# Patient Record
Sex: Male | Born: 2000
Health system: Southern US, Community
[De-identification: ages and names within clinical notes are randomized; demographics above are authoritative.]

## PROBLEM LIST (undated history)

## (undated) DIAGNOSIS — F32A Depression, unspecified: Secondary | ICD-10-CM

## (undated) DIAGNOSIS — A5600 Chlamydial infection of lower genitourinary tract, unspecified: Secondary | ICD-10-CM

## (undated) DIAGNOSIS — R1013 Epigastric pain: Secondary | ICD-10-CM

## (undated) DIAGNOSIS — F329 Major depressive disorder, single episode, unspecified: Secondary | ICD-10-CM

## (undated) HISTORY — DX: Chlamydial infection of lower genitourinary tract, unspecified: A56.00

## (undated) HISTORY — DX: Epigastric pain: R10.13

## (undated) HISTORY — DX: Depression, unspecified: F32.A

---
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
## 2022-02-14 ENCOUNTER — Ambulatory Visit (INDEPENDENT_AMBULATORY_CARE_PROVIDER_SITE_OTHER): Payer: No Typology Code available for payment source | Admitting: Family Medicine

## 2022-02-14 ENCOUNTER — Ambulatory Visit
Admission: RE | Admit: 2022-02-14 | Discharge: 2022-02-14 | Disposition: A | Discharge status: Home / Self Care | Payer: No Typology Code available for payment source | Source: Ambulatory Visit | Attending: Family Medicine | Admitting: Family Medicine

## 2022-02-14 ENCOUNTER — Encounter: Payer: Self-pay | Admitting: Family Medicine

## 2022-02-14 VITALS — BP 104/70 | HR 70 | Temp 97.0°F | Wt 159.6 lb

## 2022-02-14 DIAGNOSIS — M25532 Pain in left wrist: Secondary | ICD-10-CM

## 2022-02-14 DIAGNOSIS — M25531 Pain in right wrist: Secondary | ICD-10-CM

## 2022-02-14 NOTE — Progress Notes (Signed)
? ?  Subjective:  ? ? Patient ID: James Booker, male    DOB: Aug 09, 2001, 21 y.o.   MRN: [id number] ? ?HPI ?He is here for evaluation of 2 issues.  He states that about 1 week ago he had a weight fall and land on his right wrist at the base of the fifth metacarpal.  He still having some slight discomfort with that. ?He also states that approximately 2 months ago while wrestling he gripped somebody very strenuously with his left hand and then felt some wrist pain after that.  The pain is still present now and he notes that it bothers him especially when he lifts heavy objects. ? ? ?Review of Systems ? ?   ?Objective:  ? Physical Exam ?Exam of the right hand does show some minimal tenderness palpation at the base of the fifth metacarpal.  Full motion of the fifth finger with no wrist tenderness or swelling noted. ?Exam of the left full motion and strength no palpable tenderness over the carpals or metacarpals.  Normal motion of the fingers and wrist. ? ? ? ?   ?Assessment & Plan:  ?Pain in both wrists - Plan: DG Hand Complete Right, DG Wrist Complete Left, Ambulatory referral to Orthopedic Surgery ?I explained that I thought the right wrist is probably a contusion but will get an x-ray did be safe. ?I explained that since he continues have difficulty with the left wrist we will get an x-ray and refer to orthopedics as I think this is probably a soft tissue injury especially with the fact that this lasted for several months. ? ?

## 2022-02-27 ENCOUNTER — Encounter: Payer: Self-pay | Admitting: Orthopedic Surgery

## 2022-02-27 ENCOUNTER — Ambulatory Visit (INDEPENDENT_AMBULATORY_CARE_PROVIDER_SITE_OTHER): Payer: No Typology Code available for payment source | Admitting: Orthopedic Surgery

## 2022-02-27 DIAGNOSIS — M25532 Pain in left wrist: Secondary | ICD-10-CM

## 2022-02-27 MED ORDER — MELOXICAM 7.5 MG PO TABS
7.5000 mg | ORAL_TABLET | Freq: Every day | ORAL | 0 refills | Status: AC
Start: 2022-02-27 — End: 2022-03-29

## 2022-02-27 NOTE — Progress Notes (Signed)
? ?Office Visit Note ?  ?Patient: James Booker           ?Date of Birth: 10-Jul-2001           ?MRN: [id number] ?Visit Date: 02/27/2022 ?             ?Requested by: Denita Lung, MD ?[address] ?[place],  [place] [address] ?PCP: Denita Lung, MD ? ? ?Assessment & Plan: ?Visit Diagnoses:  ?1. Pain in left wrist   ? ? ?Plan: At this point, patient's wrist pain seems most consistent with possible FCR tendinitis.  All of his pain is at the volar aspect of the wrist at the distal aspect of the FCR tendon around the wrist flexion crease.  He has some mild discomfort with resisted wrist flexion.  The remainder of exam is unremarkable.  We will try a stronger oral anti-inflammatory and I will refer him to hand therapy.  I can see him back in 4 to 6 weeks to see how he is progressing. ? ?Follow-Up Instructions: No follow-ups on file.  ? ?Orders:  ?No orders of the defined types were placed in this encounter. ? ?No orders of the defined types were placed in this encounter. ? ? ? ? Procedures: ?No procedures performed ? ? ?Clinical Data: ?No additional findings. ? ? ?Subjective: ?Chief Complaint  ?Patient presents with  ? Left Wrist - Pain  ? ? ?This is an 21 year old right-hand-dominant male presents with left wrist pain.  This started approximately 5 months ago.  He is on the wrestling team and during a match was holding up on his arm tightly with his left hand.  After that he has pain in the volar aspect of the wrist.  He does not think there is any kind twisting movement or rotational injury.  Since then his pain has continued over the last 5 months but it has improved.  Previously had pain with any range of motion of the wrist.  Now he only has pain with lifting heavy weights.  His pain is 3/10 when lifting a heavy object.  He denies pain at the volar, radial, or ulnar aspects of the wrist.  The pain is localized to the volar radial wrist along the distal aspect of the FCR tendon.  He has taken ibuprofen  occasionally but otherwise has had no treatment for this. ? ? ?Review of Systems ? ? ?Objective: ?Vital Signs: BP (!) 118/50 (BP Location: Left Arm, Patient Position: Sitting)   Pulse 61   Ht 5' 8.5" (1.74 m)   Wt 159 lb 11.2 oz (72.4 kg)   BMI 23.93 kg/m?  ? ?Physical Exam ?Constitutional:   ?   Appearance: Normal appearance.  ?Cardiovascular:  ?   Rate and Rhythm: Normal rate.  ?   Pulses: Normal pulses.  ?Pulmonary:  ?   Effort: Pulmonary effort is normal.  ?Skin: ?   General: Skin is warm and dry.  ?   Capillary Refill: Capillary refill takes less than 2 seconds.  ?Neurological:  ?   Mental Status: He is alert.  ? ? ?Left Hand Exam  ? ?Tenderness  ?Left hand tenderness location: TTP along FCR tendon at level of wrist flexion crease.  No swelling.  ? ?Range of Motion  ?Wrist  ?Left wrist extension: 60.  ?Left wrist flexion: 55.  ?Pronation:  normal  ?Supination:  normal  ? ?Other  ?Erythema: absent ?Sensation: normal ?Pulse: present ? ?Comments:  No TTP at ulnar, dorsal, or radial aspects  of wrist.  Mild pain w/ resisted wrist flexion.  No other positive provocative signs.  No swelling.  ? ? ? ? ?Specialty Comments:  ?No specialty comments available. ? ?Imaging: ?No results found. ? ? ?PMFS History: ?Patient Active Problem List  ? Diagnosis Date Noted  ? Pain in left wrist 02/27/2022  ? Cannabis use disorder, moderate, in early remission ([place]) 09/07/2019  ? Disruptive mood dysregulation disorder ([place]) 09/07/2019  ? Penile discharge 06/15/2019  ? Venereal disease 06/15/2019  ? Dysuria 06/15/2019  ? ?Past Medical History:  ?Diagnosis Date  ? Cannabis use disorder, moderate, in early remission ([place]) 09/07/2019  ? Depression   ? Dyspepsia   ? Infection of lower genitourinary tract due to Chlamydia   ?  ?Family History  ?Problem Relation Age of Onset  ? Cancer Other   ? Healthy Mother   ? Healthy Father   ?  ?No past surgical history on file. ?Social History  ? ?Occupational History  ? Occupation: Ship broker, Science writer  ?Tobacco Use  ? Smoking status: Never  ? Smokeless tobacco: Never  ?Vaping Use  ? Vaping Use: Never used  ?Substance and Sexual Activity  ? Alcohol use: Yes  ? Drug use: Not Currently  ?  Types: Marijuana  ? Sexual activity: Yes  ?  Birth control/protection: None, Condom  ? ? ? ? ? ? ?

## 2022-03-06 ENCOUNTER — Encounter: Payer: No Typology Code available for payment source | Admitting: Rehabilitative and Restorative Service Providers"

## 2022-03-06 NOTE — Therapy (Incomplete)
?OUTPATIENT OCCUPATIONAL THERAPY ORTHO EVALUATION ? ?Patient Name: James Booker ?MRN: [id number] ?DOB:06/01/2001, 21 y.o., male ?Today's Date: 03/06/2022 ? ?PCP: Denita Lung, MD ?REFERRING PROVIDER: Sherilyn Cooter, MD ? ? ? ?Past Medical History:  ?Diagnosis Date  ? Cannabis use disorder, moderate, in early remission ([place]) 09/07/2019  ? Depression   ? Dyspepsia   ? Infection of lower genitourinary tract due to Chlamydia   ? ?No past surgical history on file. ?Patient Active Problem List  ? Diagnosis Date Noted  ? Pain in left wrist 02/27/2022  ? Cannabis use disorder, moderate, in early remission ([place]) 09/07/2019  ? Disruptive mood dysregulation disorder ([place]) 09/07/2019  ? Penile discharge 06/15/2019  ? Venereal disease 06/15/2019  ? Dysuria 06/15/2019  ? ? ?ONSET DATE: ~5 months  ? ?REFERRING DIAG:  ?  ?M25.532 (ICD-10-CM) - Pain in left wrist  ? ? ?THERAPY DIAG:  ?No diagnosis found. ? ?SUBJECTIVE:  ? ?SUBJECTIVE STATEMENT: ?*** ?Pt accompanied by: {accompnied:27141} ? ?PERTINENT HISTORY: Per MD: possible FCR tendonitis."This is an 21 year old right-hand-dominant male presents with left wrist pain.  This started approximately 5 months ago.  He is on the wrestling team and during a match was holding up on his arm tightly with his left hand.  After that he has pain in the volar aspect of the wrist.  He does not think there is any kind twisting movement or rotational injury.  Since then his pain has continued over the last 5 months but it has improved.  Previously had pain with any range of motion of the wrist.  Now he only has pain with lifting heavy weights.  His pain is 3/10 when lifting a heavy object." ? ?PRECAUTIONS: None ? ?WEIGHT BEARING RESTRICTIONS No ? ?PAIN:  ?Are you having pain? *** ?Rating: ***/10 ? ?FALLS: Has patient fallen in last 6 months? {fallsyesno:27318} ? ?LIVING ENVIRONMENT: ?Lives with: {[facility] lives with:25569::"lives with their family"} ?Lives in: {Lives in:25570} ?Stairs:  {opstairs:27293} ?Has following equipment at home: {Assistive devices:23999} ? ?PLOF: {PLOF:24004} ? ?PATIENT GOALS *** ? ?OBJECTIVE:  ? ?HAND DOMINANCE: {MISC; OT HAND DOMINANCE:[phone]} ? ?ADLs: ?Overall ADLs: *** ? ? ?FUNCTIONAL OUTCOME MEASURES: ?{OTFUNCTIONALMEASURES:27238} ? ?UE ROM    ? ?{AROM/PROM:27142} ROM Right ?03/06/2022 Left ?03/06/2022  ?Shoulder flexion    ?Shoulder abduction    ?Shoulder adduction    ?Shoulder extension    ?Shoulder internal rotation    ?Shoulder external rotation    ?Elbow flexion    ?Elbow extension    ?Wrist flexion    ?Wrist extension    ?Wrist ulnar deviation    ?Wrist radial deviation    ?Wrist pronation    ?Wrist supination    ?(Blank rows = not tested) ? ?{AROM/PROM:27142} ROM Right ?03/06/2022 Left ?03/06/2022  ?Thumb MCP (0-60)    ?Thumb IP (0-80)    ?Thumb Radial abd/add (0-55)     ?Thumb Palmar abd/add (0-45)     ?Thumb Opposition to Small Finger     ?Index MCP (0-90)     ?Index PIP (0-100)     ?Index DIP (0-70)      ?Long MCP (0-90)      ?Long PIP (0-100)      ?Long DIP (0-70)      ?Ring MCP (0-90)      ?Ring PIP (0-100)      ?Ring DIP (0-70)      ?Little MCP (0-90)      ?Little PIP (0-100)      ?Little DIP (0-70)      ?(  Blank rows = not tested) ? ? ?UE MMT:    ? ?MMT Right ?03/06/2022 Left ?03/06/2022  ?Shoulder flexion    ?Shoulder abduction    ?Shoulder adduction    ?Shoulder extension    ?Shoulder internal rotation    ?Shoulder external rotation    ?Middle trapezius    ?Lower trapezius    ?Elbow flexion    ?Elbow extension    ?Wrist flexion    ?Wrist extension    ?Wrist ulnar deviation    ?Wrist radial deviation    ?Wrist pronation    ?Wrist supination    ?(Blank rows = not tested) ? ?HAND FUNCTION: ?{handfunction:27230} ? ?COORDINATION: ?{otcoordination:27237} ? ?SENSATION: ?{sensation:27233} ? ?EDEMA: *** ? ?COGNITION: ?Overall cognitive status: {cognition:24006} ?Areas of impairment: {impairedcognition:27234} ? ?OBSERVATIONS: *** ? ? ?TODAY'S TREATMENT:  ?03/06/22:  Eval: *** ? ? ? ?PATIENT EDUCATION: ?Education details: see tx section above for details  ?Person educated: Patient ?Education method: Explanation, Demonstration, Verbal cues, and Handouts ?Education comprehension: verbalized understanding, returned demonstration, verbal cues required, and needs further education ? ? ?HOME EXERCISE PROGRAM: ?see tx section above for details  ? ?GOALS: ?Goals reviewed with patient? {yes/no:20286} ? ? ?SHORT TERM GOALS: (STG required if POC>30 days) ? ?Pt will obtain protective, custom orthotic. ?Target date: TBD as needed ?Goal status: Initial ? ?2.  Pt will demo/state understanding of initial HEP to improve pain levels and prerequisite motion. ?Target date: *** ?Goal status: INITIAL ? ? ?LONG TERM GOALS: ? ?Pt will improve functional ability by decreased impairment per Quick DASH assessment from ***% to ***% or better, for better quality of life. ?Target date: *** ?Goal status: INITIAL ? ?2.  Pt will improve grip strength in *** hand from ***lbs to at least ***lbs for functional use at home and in IADLs. ?Target date: *** ?Goal status: INITIAL ? ?3.  Pt will improve A/ROM in *** from *** to at least ***, to have functional motion for tasks like reach and grasp.  ? ?Target date: *** ?Goal status: INITIAL ? ?4.  Pt will improve strength in *** from *** MMT to at least *** MMT to have increased functional ability to carry out selfcare and higher-level homecare tasks with no difficulty. ?Target date: *** ?Goal status: INITIAL ? ?5.  Pt will decrease pain at worst from ***/10 to ***/10 or better to have better sleep and occupational participation in daily roles. ?Target date: *** ?Goal status: INITIAL ? ? ?ASSESSMENT: ? ?CLINICAL IMPRESSION: ?Patient is a 21 y.o. male who was seen today for occupational therapy evaluation for left wrist pain and decreased occupational performance.  ? ?PERFORMANCE DEFICITS in functional skills including {OT physical skills:25468}, cognitive skills  including {OT cognitive skills:25469}, and psychosocial skills including {OT psychosocial skills:25470}.  ? ?IMPAIRMENTS are limiting patient from {OT performance deficits:25471}.  ? ?COMORBIDITIES has co-morbidities such as mood disorder  that affects occupational performance. Patient will benefit from skilled OT to address above impairments and improve overall function. ? ?MODIFICATION OR ASSISTANCE TO COMPLETE EVALUATION: {OT modification:25474} ? ?OT OCCUPATIONAL PROFILE AND HISTORY: {OT PROFILE AND HISTORY:25484} ? ?CLINICAL DECISION MAKING: {OT CDM:25475} ? ?REHAB POTENTIAL: {rehabpotential:25112} ? ?EVALUATION COMPLEXITY: {Evaluation complexity:25115} ? ? ? ? ? ?PLAN: ?OT FREQUENCY: {rehab frequency:25116} ? ?OT DURATION: {rehab duration:25117} ? ?PLANNED INTERVENTIONS: {OT Interventions:25467} ? ?RECOMMENDED OTHER SERVICES: *** ? ?CONSULTED AND AGREED WITH PLAN OF CARE: [id number] ? ?PLAN FOR NEXT SESSION: *** ? ? ?Benito Mccreedy, OTR/L, CHT ?03/06/2022, 7:51 AM ?  ?

## 2022-05-21 ENCOUNTER — Ambulatory Visit (INDEPENDENT_AMBULATORY_CARE_PROVIDER_SITE_OTHER): Payer: No Typology Code available for payment source | Admitting: Surgery

## 2022-05-21 ENCOUNTER — Other Ambulatory Visit (HOSPITAL_COMMUNITY): Payer: Self-pay

## 2022-05-21 ENCOUNTER — Ambulatory Visit: Payer: Self-pay

## 2022-05-21 DIAGNOSIS — S82892A Other fracture of left lower leg, initial encounter for closed fracture: Secondary | ICD-10-CM | POA: Diagnosis not present

## 2022-05-21 DIAGNOSIS — S93401A Sprain of unspecified ligament of right ankle, initial encounter: Secondary | ICD-10-CM

## 2022-05-21 MED ORDER — TRAMADOL HCL 50 MG PO TABS
50.0000 mg | ORAL_TABLET | Freq: Four times a day (QID) | ORAL | 0 refills | Status: DC | PRN
  Filled 2022-05-21: qty 40, 10d supply, fill #0
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
## 2022-05-28 ENCOUNTER — Ambulatory Visit (INDEPENDENT_AMBULATORY_CARE_PROVIDER_SITE_OTHER): Payer: No Typology Code available for payment source | Admitting: Orthopedic Surgery

## 2022-05-28 ENCOUNTER — Encounter: Payer: Self-pay | Admitting: Orthopedic Surgery

## 2022-05-28 DIAGNOSIS — S93402A Sprain of unspecified ligament of left ankle, initial encounter: Secondary | ICD-10-CM

## 2022-05-28 DIAGNOSIS — S82892A Other fracture of left lower leg, initial encounter for closed fracture: Secondary | ICD-10-CM

## 2022-05-28 DIAGNOSIS — S93401A Sprain of unspecified ligament of right ankle, initial encounter: Secondary | ICD-10-CM
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
## 2022-05-30 ENCOUNTER — Other Ambulatory Visit (HOSPITAL_COMMUNITY): Payer: Self-pay

## 2022-06-04 ENCOUNTER — Ambulatory Visit (INDEPENDENT_AMBULATORY_CARE_PROVIDER_SITE_OTHER): Payer: No Typology Code available for payment source | Admitting: Family Medicine

## 2022-06-04 VITALS — BP 110/70 | HR 85 | Temp 97.5°F | Ht 69.0 in | Wt 155.0 lb

## 2022-06-04 DIAGNOSIS — Z0000 Encounter for general adult medical examination without abnormal findings: Secondary | ICD-10-CM | POA: Diagnosis not present

## 2022-06-04 DIAGNOSIS — Z1159 Encounter for screening for other viral diseases: Secondary | ICD-10-CM

## 2022-06-04 NOTE — Patient Instructions (Signed)
Health Maintenance, Male Adopting a healthy lifestyle and getting preventive care are important in promoting health and wellness. Ask your health care provider about: The right schedule for you to have regular tests and exams. Things you can do on your own to prevent diseases and keep yourself healthy. What should I know about diet, weight, and exercise? Eat a healthy diet  Eat a diet that includes plenty of vegetables, fruits, low-fat dairy products, and lean protein. Do not eat a lot of foods that are high in solid fats, added sugars, or sodium. Maintain a healthy weight Body mass index (BMI) is a measurement that can be used to identify possible weight problems. It estimates body fat based on height and weight. Your health care provider can help determine your BMI and help you achieve or maintain a healthy weight. Get regular exercise Get regular exercise. This is one of the most important things you can do for your health. Most adults should: Exercise for at least 150 minutes each week. The exercise should increase your heart rate and make you sweat (moderate-intensity exercise). Do strengthening exercises at least twice a week. This is in addition to the moderate-intensity exercise. Spend less time sitting. Even light physical activity can be beneficial. Watch cholesterol and blood lipids Have your blood tested for lipids and cholesterol at 20 years of age, then have this test every 5 years. You may need to have your cholesterol levels checked more often if: Your lipid or cholesterol levels are high. You are older than 19 years of age. You are at high risk for heart disease. What should I know about cancer screening? Many types of cancers can be detected early and may often be prevented. Depending on your health history and family history, you may need to have cancer screening at various ages. This may include screening for: Colorectal cancer. Prostate cancer. Skin cancer. Lung  cancer. What should I know about heart disease, diabetes, and high blood pressure? Blood pressure and heart disease High blood pressure causes heart disease and increases the risk of stroke. This is more likely to develop in people who have high blood pressure readings or are overweight. Talk with your health care provider about your target blood pressure readings. Have your blood pressure checked: Every 3-5 years if you are 18-39 years of age. Every year if you are 40 years old or older. If you are between the ages of 65 and 75 and are a current or former smoker, ask your health care provider if you should have a one-time screening for abdominal aortic aneurysm (AAA). Diabetes Have regular diabetes screenings. This checks your fasting blood sugar level. Have the screening done: Once every three years after age 45 if you are at a normal weight and have a low risk for diabetes. More often and at a younger age if you are overweight or have a high risk for diabetes. What should I know about preventing infection? Hepatitis B If you have a higher risk for hepatitis B, you should be screened for this virus. Talk with your health care provider to find out if you are at risk for hepatitis B infection. Hepatitis C Blood testing is recommended for: Everyone born from 1945 through 1965. Anyone with known risk factors for hepatitis C. Sexually transmitted infections (STIs) You should be screened each year for STIs, including gonorrhea and chlamydia, if: You are sexually active and are younger than 19 years of age. You are older than 19 years of age and your   health care provider tells you that you are at risk for this type of infection. Your sexual activity has changed since you were last screened, and you are at increased risk for chlamydia or gonorrhea. Ask your health care provider if you are at risk. Ask your health care provider about whether you are at high risk for HIV. Your health care provider  may recommend a prescription medicine to help prevent HIV infection. If you choose to take medicine to prevent HIV, you should first get tested for HIV. You should then be tested every 3 months for as long as you are taking the medicine. Follow these instructions at home: Alcohol use Do not drink alcohol if your health care provider tells you not to drink. If you drink alcohol: Limit how much you have to 0-2 drinks a day. Know how much alcohol is in your drink. In the [place] one drink equals one 12 oz bottle of beer (355 mL), one 5 oz glass of wine (148 mL), or one 1 oz glass of hard liquor (44 mL). Lifestyle Do not use any products that contain nicotine or tobacco. These products include cigarettes, chewing tobacco, and vaping devices, such as e-cigarettes. If you need help quitting, ask your health care provider. Do not use street drugs. Do not share needles. Ask your health care provider for help if you need support or information about quitting drugs. General instructions Schedule regular health, dental, and eye exams. Stay current with your vaccines. Tell your health care provider if: You often feel depressed. You have ever been abused or do not feel safe at home. Summary Adopting a healthy lifestyle and getting preventive care are important in promoting health and wellness. Follow your health care provider's instructions about healthy diet, exercising, and getting tested or screened for diseases. Follow your health care provider's instructions on monitoring your cholesterol and blood pressure. This information is not intended to replace advice given to you by your health care provider. Make sure you discuss any questions you have with your health care provider. Document Revised: 03/20/2021 Document Reviewed: 03/20/2021 Elsevier Patient Education  2023 Elsevier Inc.  

## 2022-06-04 NOTE — Progress Notes (Signed)
Complete physical exam  Patient: James Booker   DOB: [birth year]   21 y.o. Male  MRN: [id number]  Subjective:    Chief Complaint  Patient presents with   Annual Exam    James Booker is a 21 y.o. male who presents today for a complete physical exam. He reports consuming a general diet.  Staying active at least 20 min a day  He generally feels well. He reports sleeping well. He does not have additional problems to discuss today.  Smoking and drinking was discussed with him.  He is not presently sexually active.  He has a brother that has type 1 diabetes.  He is getting ready to go away to college.  He is looking forward to this.   Most recent fall risk assessment:    04/15/2018    2:03 PM  [place] in the past year? No     Most recent depression screenings:    12/08/2021   10:02 AM 05/12/2020    2:22 PM  PHQ 2/9 Scores  PHQ - 2 Score 0 0  PHQ- 9 Score 1       Patient Active Problem List   Diagnosis Date Noted   Pain in left wrist 02/27/2022   Cannabis use disorder, moderate, in early remission ([place]) 09/07/2019   Disruptive mood dysregulation disorder ([place]) 09/07/2019   Penile discharge 06/15/2019   Venereal disease 06/15/2019   Dysuria 06/15/2019   Past Medical History:  Diagnosis Date   Cannabis use disorder, moderate, in early remission ([place]) 09/07/2019   Depression    Dyspepsia    Infection of lower genitourinary tract due to Chlamydia    No past surgical history on file. Social History   Tobacco Use   Smoking status: Never   Smokeless tobacco: Never  Vaping Use   Vaping Use: Never used  Substance Use Topics   Alcohol use: Yes   Drug use: Not Currently    Types: Marijuana   Family History  Problem Relation Age of Onset   Cancer Other    Healthy Mother    Healthy Father    No Known Allergies    Patient Care Team: Denita Lung, MD as PCP - General (Family Medicine)   Outpatient Medications Prior to Visit  Medication Sig    ARIPiprazole (ABILIFY) 10 MG tablet TAKE 1 TABLET BY MOUTH AT BEDTIME. (Patient not taking: Reported on 12/08/2021)   azithromycin (ZITHROMAX) 250 MG tablet Take 2 tablets by mouth now, then take 1 tablet by mouth daily (Patient not taking: Reported on 02/14/2022)   traMADol (ULTRAM) 50 MG tablet Take 1 tablet (50 mg total) by mouth every 6 (six) hours as needed for moderate pain. (Patient not taking: Reported on 06/04/2022)   No facility-administered medications prior to visit.    Review of Systems  All other systems reviewed and are negative.         Objective:     BP 110/70   Pulse 85   Temp (!) 97.5 F (36.4 C)   Ht _0  (1.753 m)   Wt 155 lb (70.3 kg)   SpO2 97%   BMI 22.89 kg/m  BP Readings from Last 3 Encounters:  06/04/22 110/70  02/27/22 (!) 118/50  02/14/22 104/70   Wt Readings from Last 3 Encounters:  06/04/22 155 lb (70.3 kg) (56 %, Z= 0.15)*  02/27/22 159 lb 11.2 oz (72.4 kg) (64 %, Z= 0.37)*  02/14/22 159 lb 9.6 oz (72.4 kg) (64 %,  Z= 0.37)*   * Growth percentiles are based on CDC (Boys, 2-20 Years) data.      Physical Exam  Alert and in no distress. Tympanic membranes and canals are normal. Pharyngeal area is normal. Neck is supple without adenopathy or thyromegaly. Cardiac exam shows a regular sinus rhythm without murmurs or gallops. Lungs are clear to auscultation.  Abdominal exam shows no masses or tenderness.  Lowella Fairy shows normal circumcised male.  Penis and testes normal  Last CBC Lab Results  Component Value Date   WBC 6.4 01/21/2021   HGB 13.0 01/21/2021   HCT 40.4 01/21/2021   MCV 81.5 01/21/2021   MCH 26.2 01/21/2021   RDW 14.9 01/21/2021   PLT 223 64/68/0321   Last metabolic panel Lab Results  Component Value Date   GLUCOSE 88 01/21/2021   NA 139 01/21/2021   K 4.4 01/21/2021   CL 108 01/21/2021   CO2 26 01/21/2021   BUN 8 01/21/2021   CREATININE 0.99 01/21/2021   GFRNONAA NOT CALCULATED 01/21/2021   CALCIUM 9.3 01/21/2021    PROT 7.1 01/21/2021   ALBUMIN 3.9 01/21/2021   BILITOT 1.1 01/21/2021   ALKPHOS 93 01/21/2021   AST 25 01/21/2021   ALT 29 01/21/2021   ANIONGAP 5 01/21/2021   Last lipids Lab Results  Component Value Date   CHOL 158 01/21/2021   HDL 54 01/21/2021   LDLCALC 92 01/21/2021   TRIG 59 01/21/2021   CHOLHDL 2.9 01/21/2021        Assessment & Plan:    Routine Health Maintenance and Physical Exam  Immunization History  Administered Date(s) Administered   DTaP 11/15/2003, 02/03/2004, 03/14/2004, 12/26/2004, 09/22/2007   DTaP / Hep B / IPV 11/15/2003, 02/03/2004, 03/14/2004   HPV 9-valent 06/09/2018, 12/17/2018, 03/20/2019   Hep B / HiB 09/28/2004   Hepatitis A 12/26/2004, 09/01/2007   Hepatitis A, Ped/Adol-2 Dose 12/26/2004, 09/01/2007   Hepatitis B 11/15/2003, 02/03/2004, 03/14/2004, 09/28/2004   HiB (PRP-OMP) 11/15/2003, 02/03/2004, 09/28/2004   IPV 11/15/2003, 02/03/2004, 03/14/2004, 09/22/2007   Influenza Nasal 08/03/2008, 08/12/2009, 09/28/2011   Influenza Whole 10/02/2005   Influenza, Seasonal, Injecte, Preservative Fre 07/12/2010, 10/19/2014, 01/04/2016   Influenza,Quad,Nasal, Live 10/17/2012, 09/24/2013   Influenza,inj,Quad PF,6+ Mos 10/08/2018, 08/03/2019, 12/29/2021   Influenza-Unspecified 09/28/2004, 10/30/2004, 09/01/2007, 07/12/2010, 10/19/2014, 01/04/2016   MMR 09/28/2004, 09/22/2007   Meningococcal B, OMV 01/19/2021, 02/20/2021   Meningococcal Conjugate 10/19/2014   Meningococcal Mcv4o 01/19/2021   PPD Test 01/24/2009, 05/23/2010   Pneumococcal Conjugate-13 12/02/2003, 02/03/2004, 03/14/2004, 09/28/2004   Pneumococcal-Unspecified 12/02/2003, 02/03/2004, 03/14/2004, 09/28/2004   Tdap 10/19/2014   Varicella 09/28/2004, 09/22/2007    Health Maintenance  Topic Date Due   Hepatitis C Screening  Never done   INFLUENZA VACCINE  06/12/2022   HPV VACCINES  Completed   HIV Screening  Completed    Discussed health benefits of physical activity, and encouraged him  to engage in regular exercise appropriate for his age and condition.  Also discussed sexual activity and reinforced the need for condoms.  Problem List Items Addressed This Visit   None Visit Diagnoses     Routine general medical examination at a health care facility    -  Primary   Relevant Orders   Lipid panel   Need for hepatitis C screening test       Relevant Orders   Hepatitis C antibody      Return in about 1 year (around 06/05/2023) for cpe /well child .     Jill Alexanders, MD

## 2022-06-05 LAB — LIPID PANEL
Chol/HDL Ratio: 3.3 ratio (ref 0.0–5.0)
Cholesterol, Total: 177 mg/dL — ABNORMAL HIGH (ref 100–169)
HDL: 53 mg/dL (ref >39)
LDL Chol Calc (NIH): 115 mg/dL — ABNORMAL HIGH (ref 0–109)
Triglycerides: 42 mg/dL (ref 0–89)
VLDL Cholesterol Cal: 9 mg/dL (ref 5–40)

## 2022-06-05 LAB — HEPATITIS C ANTIBODY: Hep C Virus Ab: NONREACTIVE

## 2022-06-12 ENCOUNTER — Ambulatory Visit: Payer: No Typology Code available for payment source | Admitting: Orthopedic Surgery

## 2022-06-19 ENCOUNTER — Encounter: Payer: Self-pay | Admitting: Orthopedic Surgery

## 2022-06-19 ENCOUNTER — Ambulatory Visit (INDEPENDENT_AMBULATORY_CARE_PROVIDER_SITE_OTHER): Payer: No Typology Code available for payment source | Admitting: Orthopedic Surgery

## 2022-06-19 ENCOUNTER — Ambulatory Visit (INDEPENDENT_AMBULATORY_CARE_PROVIDER_SITE_OTHER): Payer: No Typology Code available for payment source

## 2022-06-19 DIAGNOSIS — S82892A Other fracture of left lower leg, initial encounter for closed fracture: Secondary | ICD-10-CM | POA: Diagnosis not present

## 2022-06-19 NOTE — Progress Notes (Signed)
Office Visit Note   Patient: James Booker           Date of Birth: [birth year]           MRN: [id number] Visit Date: 06/19/2022              Requested by: Ronnald Nian, MD [address] [place],  [place] [address] PCP: Ronnald Nian, MD  Chief Complaint  Patient presents with   Left Ankle - Fracture, Follow-up      HPI: Patient is an 21 year old gentleman who presents in follow-up for a nondisplaced posterior malleolar left ankle fracture.  Patient is currently ambulating in regular sneakers denies any pain or symptoms.  Assessment & Plan: Visit Diagnoses:  1. Fx ankle, left, closed, initial encounter     Plan: Patient will increase his activities as tolerated no restrictions.  Follow-Up Instructions: Return if symptoms worsen or fail to improve.   Ortho Exam  Patient is alert, oriented, no adenopathy, well-dressed, normal affect, normal respiratory effort. Examination patient has pain-free range of motion of the ankle and subtalar joint.  The ankle is stable the syndesmosis is not tender.  The posterior malleolus is not tender.  Imaging: XR Ankle Complete Left  Result Date: 06/19/2022 Three-view radiographs of the left ankle shows a congruent mortise no widening of the syndesmosis the posterior malleolus fracture is well-healed  No images are attached to the encounter.  Labs: Lab Results  Component Value Date   HGBA1C 5.5 01/21/2021   REPTSTATUS 12/11/2021 FINAL 12/08/2021   CULT  12/08/2021    MODERATE GROUP A STREP (S.PYOGENES) ISOLATED Beta hemolytic streptococci are predictably susceptible to penicillin and other beta lactams. Susceptibility testing not routinely performed. Performed at [facility], [address] [place], [place] [address]      Lab Results  Component Value Date   ALBUMIN 3.9 01/21/2021    No results found for: "MG" No results found for: "VD25OH"  No results found for: "PREALBUMIN"    Latest Ref Rng & Units  01/21/2021    3:30 PM  CBC EXTENDED  WBC 4.5 - 13.5 K/uL 6.4   RBC 3.80 - 5.70 MIL/uL 4.96   Hemoglobin 12.0 - 16.0 g/dL 50.0   HCT 93.8 - 18.2 % 40.4   Platelets 150 - 400 K/uL 223   NEUT# 1.7 - 8.0 K/uL 3.5   Lymph# 1.1 - 4.8 K/uL 2.1      There is no height or weight on file to calculate BMI.  Orders:  Orders Placed This Encounter  Procedures   XR Ankle Complete Left   No orders of the defined types were placed in this encounter.    Procedures: No procedures performed  Clinical Data: No additional findings.  ROS:  All other systems negative, except as noted in the HPI. Review of Systems  Objective: Vital Signs: There were no vitals taken for this visit.  Specialty Comments:  No specialty comments available.  PMFS History: Patient Active Problem List   Diagnosis Date Noted   Pain in left wrist 02/27/2022   Cannabis use disorder, moderate, in early remission (HCC) 09/07/2019   Disruptive mood dysregulation disorder (HCC) 09/07/2019   Penile discharge 06/15/2019   Venereal disease 06/15/2019   Dysuria 06/15/2019   Past Medical History:  Diagnosis Date   Cannabis use disorder, moderate, in early remission (HCC) 09/07/2019   Depression    Dyspepsia    Infection of lower genitourinary tract due to Chlamydia  Family History  Problem Relation Age of Onset   Cancer Other    Healthy Mother    Healthy Father     History reviewed. No pertinent surgical history. Social History   Occupational History   Occupation: Consulting civil engineer, Occupational hygienist  Tobacco Use   Smoking status: Never   Smokeless tobacco: Never  Vaping Use   Vaping Use: Never used  Substance and Sexual Activity   Alcohol use: Yes   Drug use: Not Currently    Types: Marijuana   Sexual activity: Yes    Birth control/protection: None, Condom

## 2022-07-01 ENCOUNTER — Ambulatory Visit (HOSPITAL_COMMUNITY)
Admission: RE | Admit: 2022-07-01 | Discharge: 2022-07-01 | Disposition: A | Discharge status: Home / Self Care | Payer: No Typology Code available for payment source | Source: Ambulatory Visit | Attending: Emergency Medicine | Admitting: Emergency Medicine

## 2022-07-01 ENCOUNTER — Encounter (HOSPITAL_COMMUNITY): Payer: Self-pay

## 2022-07-01 VITALS — BP 133/75 | HR 53 | Temp 98.6°F | Resp 18

## 2022-07-01 DIAGNOSIS — R300 Dysuria: Secondary | ICD-10-CM | POA: Diagnosis present

## 2022-07-01 DIAGNOSIS — Z113 Encounter for screening for infections with a predominantly sexual mode of transmission: Secondary | ICD-10-CM | POA: Insufficient documentation

## 2022-07-01 LAB — POCT URINALYSIS DIPSTICK, ED / UC
Bilirubin Urine: NEGATIVE
Glucose, UA: NEGATIVE mg/dL
Hgb urine dipstick: NEGATIVE
Ketones, ur: NEGATIVE mg/dL
Leukocytes,Ua: NEGATIVE
Nitrite: NEGATIVE
Protein, ur: NEGATIVE mg/dL
Specific Gravity, Urine: 1.015 (ref 1.005–1.030)
Urobilinogen, UA: 0.2 mg/dL (ref 0.0–1.0)
pH: 6.5 (ref 5.0–8.0)

## 2022-07-01 LAB — HIV ANTIBODY (ROUTINE TESTING W REFLEX): HIV Screen 4th Generation wRfx: NONREACTIVE

## 2022-07-01 NOTE — Discharge Instructions (Addendum)
We will call you if any results return positive. Please abstain from intercourse until your results return.

## 2022-07-01 NOTE — ED Provider Notes (Signed)
[facility]    CSN: [id number] Arrival date & time: 07/01/22  1013     History   Chief Complaint Chief Complaint  Patient presents with   Exposure to STD    Entered by patient    HPI James Booker is a 21 y.o. male.  Reports for 2-day history of pain with urination and a bump at the top of the penis.  Reports the bump is nonpainful.  He denies any hematuria, discharge from the penis, testicular pain or swelling.  No abdominal pain, vomiting/diarrhea.  No known exposures to STD, reports using barrier protection with intercourse.  Hx of chlamydia   Past Medical History:  Diagnosis Date   Cannabis use disorder, moderate, in early remission (HCC) 09/07/2019   Depression    Dyspepsia    Infection of lower genitourinary tract due to Chlamydia     Patient Active Problem List   Diagnosis Date Noted   Pain in left wrist 02/27/2022   Cannabis use disorder, moderate, in early remission (HCC) 09/07/2019   Disruptive mood dysregulation disorder (HCC) 09/07/2019   Penile discharge 06/15/2019   Venereal disease 06/15/2019   Dysuria 06/15/2019    History reviewed. No pertinent surgical history.     Home Medications    Prior to Admission medications   Medication Sig Start Date End Date Taking? Authorizing Provider  ARIPiprazole (ABILIFY) 10 MG tablet TAKE 1 TABLET BY MOUTH AT BEDTIME. Patient not taking: Reported on 12/08/2021 08/29/20 12/08/21  Chauncey Mann, MD  azithromycin [facility]) 250 MG tablet Take 2 tablets by mouth now, then take 1 tablet by mouth daily Patient not taking: Reported on 02/14/2022 12/08/21   Bing Neighbors, FNP  traMADol (ULTRAM) 50 MG tablet Take 1 tablet (50 mg total) by mouth every 6 (six) hours as needed for moderate pain. Patient not taking: Reported on 06/04/2022 05/21/22   Naida Sleight, PA-C    Family History Family History  Problem Relation Age of Onset   Cancer Other    Healthy Mother    Healthy Father     Social  History Social History   Tobacco Use   Smoking status: Never   Smokeless tobacco: Never  Vaping Use   Vaping Use: Never used  Substance Use Topics   Alcohol use: Yes   Drug use: Not Currently    Types: Marijuana     Allergies   Patient has no known allergies.   Review of Systems Review of Systems Per HPI  Physical Exam Triage Vital Signs ED Triage Vitals  Enc Vitals Group     BP 07/01/22 1031 133/75     Pulse Rate 07/01/22 1031 (!) 53     Resp 07/01/22 1031 18     Temp 07/01/22 1031 98.6 F (37 C)     Temp Source 07/01/22 1031 Oral     SpO2 07/01/22 1031 98 %     Weight --      Height --      Head Circumference --      Peak Flow --      Pain Score 07/01/22 1030 0     Pain Loc --      Pain Edu? --      Excl. in [place]? --    No data found.  Updated Vital Signs BP 133/75 (BP Location: Right Arm)   Pulse (!) 53   Temp 98.6 F (37 C) (Oral)   Resp 18   SpO2 98%    Physical  Exam Vitals and nursing note reviewed.  Constitutional:      General: He is not in acute distress.    Appearance: Normal appearance.  HENT:     Mouth/Throat:     Pharynx: Oropharynx is clear.  Cardiovascular:     Rate and Rhythm: Normal rate and regular rhythm.     Pulses: Normal pulses.     Heart sounds: Normal heart sounds.  Pulmonary:     Effort: Pulmonary effort is normal.     Breath sounds: Normal breath sounds.  Neurological:     Mental Status: He is alert and oriented to person, place, and time.      UC Treatments / Results  Labs (all labs ordered are listed, but only abnormal results are displayed) Labs Reviewed  HIV ANTIBODY (ROUTINE TESTING W REFLEX)  RPR  POCT URINALYSIS DIPSTICK, ED / UC  CYTOLOGY, (ORAL, ANAL, URETHRAL) ANCILLARY ONLY    EKG  Radiology No results found.  Procedures Procedures   Medications Ordered in UC Medications - No data to display  Initial Impression / Assessment and Plan / UC Course  I have reviewed the triage vital signs  and the nursing notes.  Pertinent labs & imaging results that were available during my care of the patient were reviewed by me and considered in my medical decision making (see chart for details).  Urinalysis unremarkable.  Cytology swab, HIV and RPR pending.  We will call patient with any positive result.  Discussed abstaining from intercourse until results return.  Provided STD education. Return precautions discussed.  Patient agrees to plan  Final Clinical Impressions(s) / UC Diagnoses   Final diagnoses:  Screen for STD (sexually transmitted disease)  Dysuria     Discharge Instructions      We will call you if any results return positive. Please abstain from intercourse until your results return.     ED Prescriptions   None    PDMP not reviewed this encounter.   Kathrine Haddock 07/01/22 1115

## 2022-07-01 NOTE — ED Triage Notes (Signed)
The patient c/o burning with urination, and states he has a bump to the top of his penis. The patient denies having any discharge from penis.   Started: 2 days ago

## 2022-07-02 LAB — CYTOLOGY, (ORAL, ANAL, URETHRAL) ANCILLARY ONLY
Chlamydia: NEGATIVE
Comment: NEGATIVE
Comment: NEGATIVE
Comment: NORMAL
Neisseria Gonorrhea: NEGATIVE
Trichomonas: NEGATIVE

## 2022-07-02 LAB — RPR: RPR Ser Ql: NONREACTIVE

## 2022-07-18 ENCOUNTER — Encounter: Payer: Self-pay | Admitting: Internal Medicine

## 2022-09-03 ENCOUNTER — Encounter: Payer: Self-pay | Admitting: Family Medicine

## 2022-09-03 ENCOUNTER — Ambulatory Visit (INDEPENDENT_AMBULATORY_CARE_PROVIDER_SITE_OTHER): Payer: No Typology Code available for payment source | Admitting: Family Medicine

## 2022-09-03 ENCOUNTER — Encounter: Payer: Self-pay | Admitting: Internal Medicine

## 2022-09-03 ENCOUNTER — Other Ambulatory Visit (HOSPITAL_COMMUNITY): Payer: Self-pay

## 2022-09-03 VITALS — BP 116/78 | HR 93 | Temp 98.3°F | Wt 154.6 lb

## 2022-09-03 DIAGNOSIS — N489 Disorder of penis, unspecified: Secondary | ICD-10-CM

## 2022-09-03 DIAGNOSIS — H209 Unspecified iridocyclitis: Secondary | ICD-10-CM | POA: Diagnosis not present

## 2022-09-03 MED ORDER — PREDNISOLONE ACETATE 1 % OP SUSP
1.0000 [drp] | Freq: Four times a day (QID) | OPHTHALMIC | 1 refills | Status: DC
  Filled 2022-09-03: qty 10, 50d supply, fill #0

## 2022-09-03 NOTE — Progress Notes (Signed)
   Subjective:    Patient ID: James Booker, male    DOB: [birth year], 21 y.o.   MRN: [id number]  HPI He is here for evaluation of 2 issues.  He states that approximately 4 days ago he was hit in the left eye by a projectile and now he mainly complains of light sensitivity. He alsonotes a several month history of difficulty at the tip of his penis being slightly erythematous and does get larger with an erection but no dysuria, frequency or urgency.  He was seen in an urgent care center.  RPR and HIV as well as herpes simplex test were all negative.  He also had hepatitis screening which was negative.  He has this on his cell phone.   Review of Systems     Objective:   Physical Exam Alert and in no distress.  Conjunctival injection is noted.  PERRL Exam of the penis does show some slight irritation of the meatus on the left.  Rest exam was normal.       Assessment & Plan:  Penile lesion - Plan: Ambulatory referral to Urology  Iritis - Plan: Ambulatory referral to Ophthalmology I explained that I was concerned about the photophobia and want him to be seen today by an ophthalmologist.

## 2022-10-20 ENCOUNTER — Encounter (HOSPITAL_BASED_OUTPATIENT_CLINIC_OR_DEPARTMENT_OTHER): Payer: Self-pay | Admitting: Emergency Medicine

## 2022-10-20 ENCOUNTER — Emergency Department (HOSPITAL_BASED_OUTPATIENT_CLINIC_OR_DEPARTMENT_OTHER)
Admission: EM | Admit: 2022-10-20 | Discharge: 2022-10-21 | Disposition: A | Discharge status: Home / Self Care | Payer: No Typology Code available for payment source | Attending: Emergency Medicine | Admitting: Emergency Medicine

## 2022-10-20 ENCOUNTER — Other Ambulatory Visit: Payer: Self-pay

## 2022-10-20 DIAGNOSIS — L03115 Cellulitis of right lower limb: Secondary | ICD-10-CM | POA: Insufficient documentation

## 2022-10-20 DIAGNOSIS — L02415 Cutaneous abscess of right lower limb: Secondary | ICD-10-CM | POA: Insufficient documentation

## 2022-10-20 DIAGNOSIS — M79661 Pain in right lower leg: Secondary | ICD-10-CM | POA: Diagnosis present

## 2022-10-20 LAB — CBC WITH DIFFERENTIAL/PLATELET
Abs Immature Granulocytes: 0.03 10*3/uL (ref 0.00–0.07)
Basophils Absolute: 0 10*3/uL (ref 0.0–0.1)
Basophils Relative: 0 %
Eosinophils Absolute: 0.1 10*3/uL (ref 0.0–0.5)
Eosinophils Relative: 1 %
HCT: 41 % (ref 39.0–52.0)
Hemoglobin: 13.7 g/dL (ref 13.0–17.0)
Immature Granulocytes: 0 %
Lymphocytes Relative: 15 %
Lymphs Abs: 1.5 10*3/uL (ref 0.7–4.0)
MCH: 26.6 pg (ref 26.0–34.0)
MCHC: 33.4 g/dL (ref 30.0–36.0)
MCV: 79.6 fL — ABNORMAL LOW (ref 80.0–100.0)
Monocytes Absolute: 0.6 10*3/uL (ref 0.1–1.0)
Monocytes Relative: 6 %
Neutro Abs: 7.5 10*3/uL (ref 1.7–7.7)
Neutrophils Relative %: 78 %
Platelets: 189 10*3/uL (ref 150–400)
RBC: 5.15 MIL/uL (ref 4.22–5.81)
RDW: 14.7 % (ref 11.5–15.5)
WBC: 9.7 10*3/uL (ref 4.0–10.5)
nRBC: 0 % (ref 0.0–0.2)

## 2022-10-20 NOTE — ED Triage Notes (Signed)
Pt presents to ED POV. Pt c/o insect bite of R calf 2d ago. Pt reports that he did not see anything him but he saw red bump that then turned black.

## 2022-10-20 NOTE — ED Notes (Signed)
Attempted to obtain blood work in triage. unsuccessful

## 2022-10-21 LAB — BASIC METABOLIC PANEL
Anion gap: 9 (ref 5–15)
BUN: 14 mg/dL (ref 6–20)
CO2: 26 mmol/L (ref 22–32)
Calcium: 9.1 mg/dL (ref 8.9–10.3)
Chloride: 104 mmol/L (ref 98–111)
Creatinine, Ser: 1.02 mg/dL (ref 0.61–1.24)
GFR, Estimated: >60 mL/min (ref >60)
Glucose, Bld: 125 mg/dL — ABNORMAL HIGH (ref 70–99)
Potassium: 3.4 mmol/L — ABNORMAL LOW (ref 3.5–5.1)
Sodium: 139 mmol/L (ref 135–145)

## 2022-10-21 MED ORDER — CYCLOBENZAPRINE HCL 10 MG PO TABS: 10.0000 mg | ORAL_TABLET | Freq: Three times a day (TID) | ORAL | 0 refills | Status: DC | PRN

## 2022-10-21 MED ORDER — CYCLOBENZAPRINE HCL 10 MG PO TABS
10.0000 mg | ORAL_TABLET | Freq: Once | ORAL | Status: AC
  Administered 2022-10-21: 10 mg via ORAL
  Filled 2022-10-21: qty 1

## 2022-10-21 MED ORDER — NAPROXEN 250 MG PO TABS
500.0000 mg | ORAL_TABLET | Freq: Once | ORAL | Status: AC
  Administered 2022-10-21: 500 mg via ORAL
  Filled 2022-10-21: qty 2

## 2022-10-21 MED ORDER — DOXYCYCLINE HYCLATE 100 MG PO CAPS: 100.0000 mg | ORAL_CAPSULE | Freq: Two times a day (BID) | ORAL | 0 refills | Status: DC

## 2022-10-21 MED ORDER — DOXYCYCLINE HYCLATE 100 MG PO TABS
100.0000 mg | ORAL_TABLET | Freq: Once | ORAL | Status: AC
  Administered 2022-10-21: 100 mg via ORAL
  Filled 2022-10-21: qty 1

## 2022-10-21 NOTE — ED Provider Notes (Signed)
[facility] Provider Note: Lowella Dell, MD, FACEP  CSN: [id number] MRN: [id number] ARRIVAL: 10/20/22 at 2231 ROOM: DB010/DB010   CHIEF COMPLAINT  Insect Bite   HISTORY OF PRESENT ILLNESS  10/21/22 12:35 AM James Booker is a 21 y.o. male who thinks he was bitten by something on the back of his right lower leg 3 days ago.  He has developed a small pustule there with surrounding erythema and tenderness.  He rates the pain as a 7 out of 10.  He is also having generalized muscle aches and chills and was noted to have a low-grade fever on arrival.   Past Medical History:  Diagnosis Date   Cannabis use disorder, moderate, in early remission (HCC) 09/07/2019   Depression    Dyspepsia    Infection of lower genitourinary tract due to Chlamydia     History reviewed. No pertinent surgical history.  Family History  Problem Relation Age of Onset   Cancer Other    Healthy Mother    Healthy Father     Social History   Tobacco Use   Smoking status: Never   Smokeless tobacco: Never  Vaping Use   Vaping Use: Never used  Substance Use Topics   Alcohol use: Yes   Drug use: Not Currently    Types: Marijuana    Prior to Admission medications   Medication Sig Start Date End Date Taking? Authorizing Provider  cyclobenzaprine (FLEXERIL) 10 MG tablet Take 1 tablet (10 mg total) by mouth 3 (three) times daily as needed for muscle spasms. 10/21/22  Yes Kajsa Butrum, MD  doxycycline (VIBRAMYCIN) 100 MG capsule Take 1 capsule (100 mg total) by mouth 2 (two) times daily. One po bid x 7 days 10/21/22  Yes Nakshatra Klose, MD  prednisoLONE acetate (PRED FORTE) 1 % ophthalmic suspension Place 1 drop into the left eye 4 (four) times daily. 09/03/22       Allergies Patient has no known allergies.   REVIEW OF SYSTEMS  Negative except as noted here or in the History of Present Illness.   PHYSICAL EXAMINATION  Initial Vital Signs Blood pressure 116/66, pulse 72, temperature 100 F  (37.8 C), temperature source Oral, resp. rate 17, SpO2 100 %.  Examination General: Well-developed, well-nourished male in no acute distress; appearance consistent with age of record HENT: normocephalic; atraumatic Eyes: Normal appearance Neck: supple Heart: regular rate and rhythm Lungs: clear to auscultation bilaterally Abdomen: soft; nondistended; nontender; bowel sounds present Extremities: No deformity; full range of motion; pulses normal Neurologic: Awake, alert and oriented; motor function intact in all extremities and symmetric; no facial droop Skin: Warm and dry; small pustule right lower leg with surrounding erythema:    Psychiatric: Normal mood and affect   RESULTS  Summary of this visit's results, reviewed and interpreted by myself:   EKG Interpretation  Date/Time:    Ventricular Rate:    PR Interval:    QRS Duration:   QT Interval:    QTC Calculation:   R Axis:     Text Interpretation:         Laboratory Studies: Results for orders placed or performed during the hospital encounter of 10/20/22 (from the past 24 hour(s))  CBC with Differential     Status: Abnormal   Collection Time: 10/20/22 10:45 PM  Result Value Ref Range   WBC 9.7 4.0 - 10.5 K/uL   RBC 5.15 4.22 - 5.81 MIL/uL   Hemoglobin 13.7 13.0 - 17.0 g/dL   HCT 32.4 40.1 -  52.0 %   MCV 79.6 (L) 80.0 - 100.0 fL   MCH 26.6 26.0 - 34.0 pg   MCHC 33.4 30.0 - 36.0 g/dL   RDW 77.8 24.2 - 35.3 %   Platelets 189 150 - 400 K/uL   nRBC 0.0 0.0 - 0.2 %   Neutrophils Relative % 78 %   Neutro Abs 7.5 1.7 - 7.7 K/uL   Lymphocytes Relative 15 %   Lymphs Abs 1.5 0.7 - 4.0 K/uL   Monocytes Relative 6 %   Monocytes Absolute 0.6 0.1 - 1.0 K/uL   Eosinophils Relative 1 %   Eosinophils Absolute 0.1 0.0 - 0.5 K/uL   Basophils Relative 0 %   Basophils Absolute 0.0 0.0 - 0.1 K/uL   Immature Granulocytes 0 %   Abs Immature Granulocytes 0.03 0.00 - 0.07 K/uL  Basic metabolic panel     Status: Abnormal    Collection Time: 10/20/22 10:45 PM  Result Value Ref Range   Sodium 139 135 - 145 mmol/L   Potassium 3.4 (L) 3.5 - 5.1 mmol/L   Chloride 104 98 - 111 mmol/L   CO2 26 22 - 32 mmol/L   Glucose, Bld 125 (H) 70 - 99 mg/dL   BUN 14 6 - 20 mg/dL   Creatinine, Ser 6.14 0.61 - 1.24 mg/dL   Calcium 9.1 8.9 - 43.1 mg/dL   GFR, Estimated >54 >00 mL/min   Anion gap 9 5 - 15   Imaging Studies: No results found.  ED COURSE and MDM  Nursing notes, initial and subsequent vitals signs, including pulse oximetry, reviewed and interpreted by myself.  Vitals:   10/20/22 2237 10/20/22 2345 10/21/22 0045  BP: 128/71 116/66 125/70  Pulse: (!) 105 72 87  Resp: 18 17 18   Temp: 100 F (37.8 C)    TempSrc: Oral    SpO2: 100% 100% 100%   Medications  doxycycline (VIBRA-TABS) tablet 100 mg (has no administration in time range)  cyclobenzaprine (FLEXERIL) tablet 10 mg (has no administration in time range)  naproxen (NAPROSYN) tablet 500 mg (has no administration in time range)    Needle I&D performed of the patient's pustule.  Pus was sent for culture.  It has the appearance of a MRSA infection and we will treat with doxycycline.  It is unclear if the patient's other symptoms are related to the purported insect or spider bite or if he has an unrelated viral illness.  PROCEDURES  Procedures INCISION AND DRAINAGE Performed by: Jakel Alphin Consent: Verbal consent obtained. Risks and benefits: risks, benefits and alternatives were discussed Type: abscess  Body area: Right lower leg  Anesthesia: None  Incision was made with a #18 needle.  Complexity: Simple  Drainage: purulent  Drainage amount: Small  Packing material: None  Patient tolerance: Patient tolerated the procedure well with no immediate complications.   ED DIAGNOSES     ICD-10-CM   1. Cellulitis and abscess of right lower extremity  L03.115    L02.415          Taliana Mersereau, Carlisle Beers, MD 10/21/22 [phone]

## 2022-10-22 ENCOUNTER — Telehealth: Payer: Self-pay | Admitting: Family Medicine

## 2022-10-22 NOTE — Telephone Encounter (Signed)
Transition Care Management Follow-up Telephone Call Date of discharge and from where: 10/20/2022 [facility] How have you been since you were released from the hospital? better Any questions or concerns? No  Items Reviewed: Did the pt receive and understand the discharge instructions provided? Yes  Medications obtained and verified? Yes  Other? No  Any new allergies since your discharge? No  Dietary orders reviewed? No Do you have support at home? Yes   Home Care and Equipment/Supplies: Were home health services ordered? not applicable  If their condition worsens, is the pt aware to call PCP or go to the Emergency Dept.? Yes Was the patient provided with contact information for the PCP's office or ED? Yes Was to pt encouraged to call back with questions or concerns? Yes  Pt declined appt until lab comes back from ER

## 2022-10-24 LAB — AEROBIC CULTURE W GRAM STAIN (SUPERFICIAL SPECIMEN)
Culture: NO GROWTH
Gram Stain: NONE SEEN

## 2023-01-19 ENCOUNTER — Encounter (HOSPITAL_COMMUNITY): Payer: Self-pay

## 2023-01-19 ENCOUNTER — Ambulatory Visit (HOSPITAL_COMMUNITY)
Admission: EM | Admit: 2023-01-19 | Discharge: 2023-01-19 | Disposition: A | Discharge status: Home / Self Care | Payer: Commercial Managed Care - PPO | Attending: Emergency Medicine | Admitting: Emergency Medicine

## 2023-01-19 DIAGNOSIS — Z113 Encounter for screening for infections with a predominantly sexual mode of transmission: Secondary | ICD-10-CM

## 2023-01-19 LAB — HIV ANTIBODY (ROUTINE TESTING W REFLEX): HIV Screen 4th Generation wRfx: NONREACTIVE

## 2023-01-19 NOTE — ED Provider Notes (Signed)
[place]    CSN: [id number] Arrival date & time: 01/19/23  1730      History   Chief Complaint Chief Complaint  Patient presents with   Exposure to STD    HPI James Booker is a 22 y.o. male.  Here for STD testing including blood work Denies exposures He is not having any symptoms.  Past Medical History:  Diagnosis Date   Cannabis use disorder, moderate, in early remission ([place]) 09/07/2019   Depression    Dyspepsia    Infection of lower genitourinary tract due to Chlamydia     Patient Active Problem List   Diagnosis Date Noted   Pain in left wrist 02/27/2022   Cannabis use disorder, moderate, in early remission ([place]) 09/07/2019   Disruptive mood dysregulation disorder ([place]) 09/07/2019   Penile discharge 06/15/2019   Venereal disease 06/15/2019   Dysuria 06/15/2019    History reviewed. No pertinent surgical history.     Home Medications    Prior to Admission medications   Not on File    Family History Family History  Problem Relation Age of Onset   Cancer Other    Healthy Mother    Healthy Father     Social History Social History   Tobacco Use   Smoking status: Never   Smokeless tobacco: Never  Vaping Use   Vaping Use: Never used  Substance Use Topics   Alcohol use: Yes   Drug use: Not Currently    Types: Marijuana     Allergies   Patient has no known allergies.   Review of Systems Review of Systems Negative as per HPI  Physical Exam Triage Vital Signs ED Triage Vitals  Enc Vitals Group     BP 01/19/23 1815 130/80     Pulse Rate 01/19/23 1818 78     Resp 01/19/23 1815 18     Temp 01/19/23 1815 99.1 F (37.3 C)     Temp Source 01/19/23 1815 Oral     SpO2 01/19/23 1815 98 %     Weight --      Height --      Head Circumference --      Peak Flow --      Pain Score --      Pain Loc --      Pain Edu? --      Excl. in [place]? --    No data found.  Updated Vital Signs BP 130/80 (BP Location: Left Arm)   Pulse 78    Temp 99.1 F (37.3 C) (Oral)   Resp 18   SpO2 98%   Visual Acuity Right Eye Distance:   Left Eye Distance:   Bilateral Distance:    Right Eye Near:   Left Eye Near:    Bilateral Near:     Physical Exam Vitals and nursing note reviewed.  Constitutional:      General: He is not in acute distress.    Appearance: Normal appearance.  HENT:     Mouth/Throat:     Pharynx: Oropharynx is clear.  Cardiovascular:     Rate and Rhythm: Normal rate and regular rhythm.     Pulses: Normal pulses.  Pulmonary:     Effort: Pulmonary effort is normal.  Neurological:     Mental Status: He is alert and oriented to person, place, and time.      UC Treatments / Results  Labs (all labs ordered are listed, but only abnormal results are displayed) Labs Reviewed  HIV ANTIBODY (ROUTINE TESTING W REFLEX)  RPR  CYTOLOGY, (ORAL, ANAL, URETHRAL) ANCILLARY ONLY    EKG   Radiology No results found.  Procedures Procedures (including critical care time)  Medications Ordered in UC Medications - No data to display  Initial Impression / Assessment and Plan / UC Course  I have reviewed the triage vital signs and the nursing notes.  Pertinent labs & imaging results that were available during my care of the patient were reviewed by me and considered in my medical decision making (see chart for details).  Cytoswab, HIV, RPR pending Treat positive result as indicated   Final Clinical Impressions(s) / UC Diagnoses   Final diagnoses:  Screen for STD (sexually transmitted disease)     Discharge Instructions      We will call you if anything on your swab or blood work returns positive. Results will also be on mychart. Please abstain from sexual intercourse until your results return.      ED Prescriptions   None    PDMP not reviewed this encounter.   Les Pou, [place] 01/19/23 [id number]

## 2023-01-19 NOTE — ED Triage Notes (Signed)
Pt is here for STD- testing. Denies any symptoms at this time. Pt would like blood work today.

## 2023-01-19 NOTE — Discharge Instructions (Addendum)
We will call you if anything on your swab or blood work returns positive. Results will also be on mychart. Please abstain from sexual intercourse until your results return.

## 2023-01-20 LAB — RPR: RPR Ser Ql: NONREACTIVE

## 2023-01-21 LAB — CYTOLOGY, (ORAL, ANAL, URETHRAL) ANCILLARY ONLY
Chlamydia: NEGATIVE
Comment: NEGATIVE
Comment: NEGATIVE
Comment: NORMAL
Neisseria Gonorrhea: NEGATIVE
Trichomonas: NEGATIVE

## 2023-04-01 ENCOUNTER — Ambulatory Visit (HOSPITAL_COMMUNITY)
Admission: EM | Admit: 2023-04-01 | Discharge: 2023-04-01 | Disposition: A | Discharge status: Home / Self Care | Payer: Commercial Managed Care - PPO | Attending: Family Medicine | Admitting: Family Medicine

## 2023-04-01 ENCOUNTER — Encounter (HOSPITAL_COMMUNITY): Payer: Self-pay

## 2023-04-01 DIAGNOSIS — Z202 Contact with and (suspected) exposure to infections with a predominantly sexual mode of transmission: Secondary | ICD-10-CM | POA: Diagnosis not present

## 2023-04-01 DIAGNOSIS — N4889 Other specified disorders of penis: Secondary | ICD-10-CM

## 2023-04-01 MED ORDER — DOXYCYCLINE HYCLATE 100 MG PO CAPS: 100.0000 mg | ORAL_CAPSULE | Freq: Two times a day (BID) | ORAL | 0 refills | Status: DC

## 2023-04-01 NOTE — ED Provider Notes (Signed)
[facility]   [id number] 04/01/23 Arrival Time: 1059  ASSESSMENT & PLAN:  1. Exposure to STD   2. Penile irritation       Discharge Instructions      You have been given the following today for treatment of possible chlamydia: Please pick up your prescription for doxycycline 100 mg and begin taking twice daily for the next seven (7) days.  Even though we have treated you today, we have sent testing for sexually transmitted infections. We will notify you of any positive results once they are received. If required, we will prescribe any medications you might need.  Please refrain from all sexual activity for at least the next seven days.     Pending: Labs Reviewed  CYTOLOGY, (ORAL, ANAL, URETHRAL) ANCILLARY ONLY    Will notify of any positive results. Instructed to refrain from sexual activity for at least seven days.  Reviewed expectations re: course of current medical issues. Questions answered. Outlined signs and symptoms indicating need for more acute intervention. Patient verbalized understanding. After Visit Summary given.   SUBJECTIVE:  James Booker is a 22 y.o. male who presents with complaint of penile irritation/very slight dysuria. Onset abrupt. First noticed  few d ago; reports sexual partner tested + for chlamydia . Denies: urinary frequency and gross hematuria. Afebrile. No abdominal or pelvic pain. No n/v. No rashes or lesions.   OBJECTIVE:  Vitals:   04/01/23 1134  BP: 120/70  Pulse: 60  Resp: 18  Temp: 98.3 F (36.8 C)  TempSrc: Oral  SpO2: 98%     General appearance: alert, cooperative, appears stated age and no distress Throat: lips, mucosa, and tongue normal; teeth and gums normal Abdomen: soft, non-tender GU: deferred Skin: warm and dry Psychological: alert and cooperative; normal mood and affect.    Labs Reviewed  CYTOLOGY, (ORAL, ANAL, URETHRAL) ANCILLARY ONLY    No Known Allergies  Past Medical History:   Diagnosis Date   Cannabis use disorder, moderate, in early remission (HCC) 09/07/2019   Depression    Dyspepsia    Infection of lower genitourinary tract due to Chlamydia    Family History  Problem Relation Age of Onset   Cancer Other    Healthy Mother    Healthy Father    Social History   Socioeconomic History   Marital status: Single    Spouse name: Not on file   Number of children: Not on file   Years of education: Not on file   Highest education level: 9th grade  Occupational History   Occupation: Consulting civil engineer, Occupational hygienist  Tobacco Use   Smoking status: Never   Smokeless tobacco: Never  Vaping Use   Vaping Use: Never used  Substance and Sexual Activity   Alcohol use: Yes   Drug use: Not Currently    Types: Marijuana   Sexual activity: Yes    Birth control/protection: None, Condom  Other Topics Concern   Not on file  Social History Narrative   [name] is 10th grade student at [employer] school in honors classes with all A's and B's 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  Mother disapproves of his swearing lyrics while patient reports that he has a song in his pocket but will not allow me to read the lyrics.  Neither patient or mother are comprehensive in describing [facility] and [place] charges against the patient allegedly for breaking into cars seeking money to fund the development of his rap career, armed with a gun and knife.  Mother notes the patient will have legal representation but the patient doubts all of the charges can be sustained due to lack of evidence.  They note the patient turns 18 this Saturday and get his driver's permit 16/08/9603, father already having purchased a car for him, patient asserting that he drives very well anyway.  Patient requests of mother that girlfriend be allowed to visit at their home and he  apparently has a couple of friends that are allowed to come over. Parents fear that he otherwise associates with a peer group that could be destructive.  Patient acknowledges that he has been threatened with death by some peers and even by father who has been angry with him attempting to help restore appropriate behavior in the patient possibly since they moved from [place] to [place] in December 2018 residing with paternal grandmother until home and jobs established with parents being from [place] but being in the [employer] in [place].  Older brother is away at college, and there are 2 younger brothers and all of the family looks up to the patient who mother states proclaims to the family that he does not want to live with the family and is not happy.   Social Determinants of Health   Financial Resource Strain: Low Risk  (09/07/2019)   Overall Financial Resource Strain (CARDIA)    Difficulty of Paying Living Expenses: Not hard at all  Food Insecurity: No Food Insecurity (09/07/2019)   Hunger Vital Sign    Worried About Running Out of Food in the Last Year: Never true    Ran Out of Food in the Last Year: Never true  Transportation Needs: No Transportation Needs (09/07/2019)   PRAPARE - Administrator, Civil Service (Medical): No    Lack of Transportation (Non-Medical): No  Physical Activity: Not on file  Stress: Not on file  Social Connections: Not on file  Intimate Partner Violence: Not on file           Mardella Layman, MD 04/01/23 1145

## 2023-04-01 NOTE — Discharge Instructions (Signed)
You have been given the following today for treatment of possible chlamydia.  Please pick up your prescription for doxycycline 100 mg and begin taking twice daily for the next seven (7) days.  Even though we have treated you today, we have sent testing for sexually transmitted infections. We will notify you of any positive results once they are received. If required, we will prescribe any medications you might need.  Please refrain from all sexual activity for at least the next seven days.  

## 2023-04-01 NOTE — ED Triage Notes (Signed)
Pt states his partner was positive for chlamydia. Denies sx's.

## 2023-04-02 LAB — CYTOLOGY, (ORAL, ANAL, URETHRAL) ANCILLARY ONLY
Chlamydia: NEGATIVE
Comment: NEGATIVE
Comment: NEGATIVE
Comment: NORMAL
Neisseria Gonorrhea: NEGATIVE
Trichomonas: NEGATIVE

## 2023-04-12 ENCOUNTER — Encounter: Payer: Self-pay | Admitting: Internal Medicine

## 2023-04-23 ENCOUNTER — Ambulatory Visit: Admitting: Family Medicine

## 2023-05-06 ENCOUNTER — Encounter: Payer: Self-pay | Admitting: Family Medicine

## 2023-06-06 ENCOUNTER — Encounter: Payer: No Typology Code available for payment source | Admitting: Family Medicine

## 2023-06-11 ENCOUNTER — Encounter: Payer: Self-pay | Admitting: Family Medicine

## 2023-06-11 ENCOUNTER — Ambulatory Visit (INDEPENDENT_AMBULATORY_CARE_PROVIDER_SITE_OTHER): Payer: Commercial Managed Care - PPO | Admitting: Family Medicine

## 2023-06-11 VITALS — BP 112/60 | HR 99 | Temp 98.1°F | Resp 14 | Wt 162.0 lb

## 2023-06-11 DIAGNOSIS — Z209 Contact with and (suspected) exposure to unspecified communicable disease: Secondary | ICD-10-CM | POA: Diagnosis not present

## 2023-06-11 NOTE — Progress Notes (Signed)
   Subjective:    Patient ID: James Booker, male    DOB: 12/10/2000, 22 y.o.   MRN: [id number]  HPI He is here for consult concerning possible STD.  He had unprotected sex approximately 10 days ago but has not had any discharge or dysuria.   Review of Systems     Objective:    Physical Exam Alert  and in no distress otherwise not examined       Assessment & Plan:  Contact with or exposure to communicable disease - Plan: RPR+HIV+GC+CT Panel Strongly encouraged him to use condoms.

## 2023-06-13 ENCOUNTER — Encounter: Payer: Self-pay | Admitting: Family Medicine

## 2023-06-17 MED ORDER — DOXYCYCLINE HYCLATE 100 MG PO TABS: 100.0000 mg | ORAL_TABLET | Freq: Two times a day (BID) | ORAL | 0 refills | Status: DC

## 2023-08-31 ENCOUNTER — Other Ambulatory Visit: Payer: Self-pay | Admitting: Family Medicine

## 2023-09-03 MED ORDER — DOXYCYCLINE HYCLATE 100 MG PO TABS
100.0000 mg | ORAL_TABLET | Freq: Two times a day (BID) | ORAL | 0 refills | Status: DC
Start: 2023-09-03 — End: 2023-09-05

## 2023-09-05 ENCOUNTER — Other Ambulatory Visit: Payer: Self-pay | Admitting: Family Medicine

## 2023-09-05 ENCOUNTER — Encounter: Payer: Self-pay | Admitting: Family Medicine

## 2023-09-05 MED ORDER — DOXYCYCLINE HYCLATE 100 MG PO TABS: 100.0000 mg | ORAL_TABLET | Freq: Two times a day (BID) | ORAL | 0 refills | Status: DC

## 2023-09-21 ENCOUNTER — Encounter: Payer: Self-pay | Admitting: Family Medicine

## 2023-09-24 DIAGNOSIS — Z202 Contact with and (suspected) exposure to infections with a predominantly sexual mode of transmission: Secondary | ICD-10-CM | POA: Diagnosis not present

## 2023-09-24 DIAGNOSIS — Z113 Encounter for screening for infections with a predominantly sexual mode of transmission: Secondary | ICD-10-CM | POA: Diagnosis not present

## 2023-09-24 DIAGNOSIS — R300 Dysuria: Secondary | ICD-10-CM | POA: Diagnosis not present

## 2023-10-13 ENCOUNTER — Encounter (HOSPITAL_COMMUNITY): Payer: Self-pay | Admitting: Emergency Medicine

## 2023-10-13 ENCOUNTER — Ambulatory Visit (HOSPITAL_COMMUNITY)
Admission: EM | Admit: 2023-10-13 | Discharge: 2023-10-13 | Disposition: A | Discharge status: Home / Self Care | Payer: Commercial Managed Care - PPO | Attending: Emergency Medicine | Admitting: Emergency Medicine

## 2023-10-13 ENCOUNTER — Other Ambulatory Visit: Payer: Self-pay

## 2023-10-13 DIAGNOSIS — Z113 Encounter for screening for infections with a predominantly sexual mode of transmission: Secondary | ICD-10-CM | POA: Diagnosis not present

## 2023-10-13 NOTE — ED Triage Notes (Signed)
Patient reports noticing bumps in pubic hair and did pop a bump.  Patient denies penile discharge.

## 2023-10-13 NOTE — Discharge Instructions (Addendum)
You may be had a small ingrown hair, that area appears to be healing.  It does not look like herpes simplex virus.  You have been screened for sexually transmitted infections today in clinic and our staff will contact you if anything results is abnormal to initiate the appropriate treatment.  Return to clinic for any new or urgent symptoms.

## 2023-10-13 NOTE — ED Provider Notes (Addendum)
[facility]    CSN: [id number] Arrival date & time: 10/13/23  1414      History   Chief Complaint Chief Complaint  Patient presents with   SEXUALLY TRANSMITTED DISEASE    HPI James Booker is a 22 y.o. male.   Patient presents to clinic requesting sexually-transmitted infection screening.  He was sexually active with a new male partner around 3 days ago without protection and then he noticed a bump in his pubic hair.  He did pop the bump and it has gotten much smaller in size and it is no longer painful.  He has not had any new bumps.  No penile discharge or dysuria.  He would like HIV and syphilis screening.  The history is provided by the patient and medical records.    Past Medical History:  Diagnosis Date   Cannabis use disorder, moderate, in early remission (HCC) 09/07/2019   Depression    Dyspepsia    Infection of lower genitourinary tract due to Chlamydia     Patient Active Problem List   Diagnosis Date Noted   Pain in left wrist 02/27/2022   Cannabis use disorder, moderate, in early remission (HCC) 09/07/2019   Disruptive mood dysregulation disorder (HCC) 09/07/2019   Penile discharge 06/15/2019   Venereal disease 06/15/2019   Dysuria 06/15/2019    History reviewed. No pertinent surgical history.     Home Medications    Prior to Admission medications   Medication Sig Start Date End Date Taking? Authorizing Provider  doxycycline (VIBRA-TABS) 100 MG tablet Take 1 tablet (100 mg total) by mouth 2 (two) times daily. Patient not taking: Reported on 10/13/2023 09/05/23   Ronnald Nian, MD    Family History Family History  Problem Relation Age of Onset   Cancer Other    Healthy Mother    Healthy Father     Social History Social History   Tobacco Use   Smoking status: Never   Smokeless tobacco: Never  Vaping Use   Vaping status: Never Used  Substance Use Topics   Alcohol use: Yes   Drug use: Not Currently    Types: Marijuana      Allergies   Patient has no known allergies.   Review of Systems Review of Systems  Per HPI   Physical Exam Triage Vital Signs ED Triage Vitals  Encounter Vitals Group     BP 10/13/23 1526 134/81     Systolic BP Percentile --      Diastolic BP Percentile --      Pulse Rate 10/13/23 1526 77     Resp 10/13/23 1526 18     Temp 10/13/23 1526 98.5 F (36.9 C)     Temp Source 10/13/23 1526 Oral     SpO2 10/13/23 1526 98 %     Weight --      Height --      Head Circumference --      Peak Flow --      Pain Score 10/13/23 1525 0     Pain Loc --      Pain Education --      Exclude from Growth Chart --    No data found.  Updated Vital Signs BP 134/81 (BP Location: Left Arm)   Pulse 77   Temp 98.5 F (36.9 C) (Oral)   Resp 18   SpO2 98%   Visual Acuity Right Eye Distance:   Left Eye Distance:   Bilateral Distance:    Right Eye  Near:   Left Eye Near:    Bilateral Near:     Physical Exam Vitals and nursing note reviewed. Exam conducted with a chaperone present.  Constitutional:      Appearance: Normal appearance.  HENT:     Head: Normocephalic and atraumatic.     Right Ear: External ear normal.     Left Ear: External ear normal.     Nose: Nose normal.     Mouth/Throat:     Mouth: Mucous membranes are moist.  Eyes:     Conjunctiva/sclera: Conjunctivae normal.  Cardiovascular:     Rate and Rhythm: Normal rate.  Pulmonary:     Effort: Pulmonary effort is normal. No respiratory distress.  Genitourinary:      Comments: Small healing pustule at top of pubic are, no vesicular lesions or grouped lesion, only singular healing lesion.  Musculoskeletal:        General: Normal range of motion.  Skin:    General: Skin is warm and dry.  Neurological:     General: No focal deficit present.     Mental Status: He is alert.  Psychiatric:        Mood and Affect: Mood normal.        Behavior: Behavior is cooperative.      UC Treatments / Results  Labs (all  labs ordered are listed, but only abnormal results are displayed) Labs Reviewed  CYTOLOGY, (ORAL, ANAL, URETHRAL) ANCILLARY ONLY    EKG   Radiology No results found.  Procedures Procedures (including critical care time)  Medications Ordered in UC Medications - No data to display  Initial Impression / Assessment and Plan / UC Course  I have reviewed the triage vital signs and the nursing notes.  Pertinent labs & imaging results that were available during my care of the patient were reviewed by me and considered in my medical decision making (see chart for details).  Vitals and triage reviewed, patient is hemodynamically stable.  STI screening obtained.  Small healing lesion to the top of the mons pubis in the pubic area is healing and does not appear to be herpes simplex virus or vesicular in nature.  No area to unroofed, HSV swab deferred.  Plan of care, follow-up care and return precautions given, no questions at this time.  When staff was attempting to draw lab work, patient declined.  Orders for HIV and syphilis  canceled.    Final Clinical Impressions(s) / UC Diagnoses   Final diagnoses:  Screening examination for sexually transmitted disease     Discharge Instructions      You may be had a small ingrown hair, that area appears to be healing.  It does not look like herpes simplex virus.  You have been screened for sexually transmitted infections today in clinic and our staff will contact you if anything results is abnormal to initiate the appropriate treatment.  Return to clinic for any new or urgent symptoms.    ED Prescriptions   None    PDMP not reviewed this encounter.   Marie Borowski, [place] N, FNP 10/13/23 1541    Serapio Edelson, [place] N, [place] 10/13/23 [phone]

## 2023-10-14 LAB — CYTOLOGY, (ORAL, ANAL, URETHRAL) ANCILLARY ONLY
Chlamydia: NEGATIVE
Comment: NEGATIVE
Comment: NEGATIVE
Comment: NORMAL
Neisseria Gonorrhea: NEGATIVE
Trichomonas: NEGATIVE

## 2023-10-16 ENCOUNTER — Encounter: Payer: Self-pay | Admitting: Family Medicine

## 2023-10-16 ENCOUNTER — Ambulatory Visit: Payer: Commercial Managed Care - PPO | Admitting: Family Medicine

## 2023-10-31 ENCOUNTER — Ambulatory Visit: Payer: Commercial Managed Care - PPO | Admitting: Family Medicine

## 2023-10-31 ENCOUNTER — Encounter: Payer: Self-pay | Admitting: Family Medicine

## 2023-10-31 VITALS — BP 114/72 | HR 61 | Temp 98.3°F | Wt 158.6 lb

## 2023-10-31 DIAGNOSIS — N341 Nonspecific urethritis: Secondary | ICD-10-CM | POA: Diagnosis not present

## 2023-10-31 LAB — POCT URINALYSIS DIP (PROADVANTAGE DEVICE)
Bilirubin, UA: NEGATIVE
Blood, UA: NEGATIVE
Glucose, UA: NEGATIVE mg/dL
Ketones, POC UA: NEGATIVE mg/dL
Nitrite, UA: NEGATIVE
Protein Ur, POC: NEGATIVE mg/dL
Specific Gravity, Urine: 1.025
Urobilinogen, Ur: 0.2
pH, UA: 6 (ref 5.0–8.0)

## 2023-10-31 MED ORDER — DOXYCYCLINE HYCLATE 100 MG PO TABS: 100.0000 mg | ORAL_TABLET | Freq: Two times a day (BID) | ORAL | 0 refills | Status: DC

## 2023-10-31 NOTE — Progress Notes (Signed)
   Subjective:    Patient ID: James Booker, male    DOB: [birth year], 22 y.o.   MRN: [id number]  HPI He is here for consult concerning continued difficulty with urinary hesitancy as well as slight discharge.  He has been checked several times here and at school for STDs and apparently all these tested, negative.  There is been at least 1 occasion when the tests were negative but he was treated with doxycycline and Rocephin.  He thinks that that might of possibly help slightly.  His last evaluation for treatment Vascepa early December and the testing was negative.   Review of Systems     Objective:    Physical Exam Alert and in no distress.  Genital exam shows normal penis and testes.       Assessment & Plan:  Nongonococcal urethritis - Plan: doxycycline (VIBRA-TABS) 100 MG tablet Since he has had intermittent difficulty with this, this could be NGU and I will treat him for 2 weeks with doxycycline.  Continued difficulty may need to get further urologic evaluation.  Encouraged him to avoid sexual activity for the next couple of weeks.

## 2023-12-24 ENCOUNTER — Other Ambulatory Visit: Payer: Self-pay | Admitting: Family Medicine

## 2023-12-24 DIAGNOSIS — N341 Nonspecific urethritis: Secondary | ICD-10-CM

## 2023-12-24 MED ORDER — DOXYCYCLINE HYCLATE 100 MG PO TABS: 100.0000 mg | ORAL_TABLET | Freq: Two times a day (BID) | ORAL | 0 refills | Status: DC

## 2023-12-24 NOTE — Addendum Note (Signed)
Addended by: Debbrah Alar F on: 12/24/2023 04:13 PM   Modules accepted: Orders

## 2023-12-24 NOTE — Telephone Encounter (Signed)
Sent to [facility] in [facility] as requested.

## 2024-01-07 ENCOUNTER — Encounter: Payer: Self-pay | Admitting: Internal Medicine

## 2024-01-24 ENCOUNTER — Encounter: Payer: Self-pay | Admitting: Family Medicine

## 2024-01-27 DIAGNOSIS — Z202 Contact with and (suspected) exposure to infections with a predominantly sexual mode of transmission: Secondary | ICD-10-CM | POA: Diagnosis not present

## 2024-01-27 DIAGNOSIS — Z113 Encounter for screening for infections with a predominantly sexual mode of transmission: Secondary | ICD-10-CM | POA: Diagnosis not present

## 2024-01-27 DIAGNOSIS — Z7251 High risk heterosexual behavior: Secondary | ICD-10-CM | POA: Diagnosis not present

## 2024-01-27 DIAGNOSIS — R369 Urethral discharge, unspecified: Secondary | ICD-10-CM | POA: Diagnosis not present

## 2024-02-27 DIAGNOSIS — R300 Dysuria: Secondary | ICD-10-CM | POA: Diagnosis not present

## 2024-02-27 DIAGNOSIS — Z113 Encounter for screening for infections with a predominantly sexual mode of transmission: Secondary | ICD-10-CM | POA: Diagnosis not present

## 2024-02-27 DIAGNOSIS — Z9189 Other specified personal risk factors, not elsewhere classified: Secondary | ICD-10-CM | POA: Diagnosis not present

## 2024-02-27 DIAGNOSIS — Z7251 High risk heterosexual behavior: Secondary | ICD-10-CM | POA: Diagnosis not present

## 2024-03-23 ENCOUNTER — Telehealth: Payer: Self-pay | Admitting: Urology

## 2024-03-23 NOTE — Telephone Encounter (Signed)
 LVM for patient to call back with information on whether he had been anywhere outside our office for this concern, and to get a clearer idea of what he would be coming in for. There are some notes from December of 2024 that might pertain to what he is coming in for.

## 2024-04-30 ENCOUNTER — Ambulatory Visit: Admitting: Family Medicine

## 2024-05-04 ENCOUNTER — Encounter: Payer: Self-pay | Admitting: Urology

## 2024-05-04 ENCOUNTER — Ambulatory Visit (INDEPENDENT_AMBULATORY_CARE_PROVIDER_SITE_OTHER): Admitting: Urology

## 2024-05-04 VITALS — BP 105/52 | HR 53 | Ht 69.0 in | Wt 157.0 lb

## 2024-05-04 DIAGNOSIS — Z7251 High risk heterosexual behavior: Secondary | ICD-10-CM | POA: Diagnosis not present

## 2024-05-04 DIAGNOSIS — N342 Other urethritis: Secondary | ICD-10-CM

## 2024-05-04 LAB — URINALYSIS, ROUTINE W REFLEX MICROSCOPIC
Bilirubin, UA: NEGATIVE
Glucose, UA: NEGATIVE
Nitrite, UA: NEGATIVE
Protein,UA: NEGATIVE
RBC, UA: NEGATIVE
Specific Gravity, UA: 1.025 (ref 1.005–1.030)
Urobilinogen, Ur: 0.2 mg/dL (ref 0.2–1.0)
pH, UA: 6 (ref 5.0–7.5)

## 2024-05-04 LAB — MICROSCOPIC EXAMINATION

## 2024-05-04 NOTE — Progress Notes (Signed)
 Assessment: 1. Urethritis   2. High risk heterosexual behavior     Plan: I personally reviewed the patient's chart including provider notes, and lab results. His exam is normal today.  He does not have any evidence of trichomonas on his urinalysis. He is requesting further testing for STIs. STI panel sent today.  Will contact him with results. Discussed limiting high risk sexual behavior to reduce his risk of STIs.   Given the intermittency of his symptoms, I think that any type of urethral stricture would be unlikely. Return to office as needed.  Chief Complaint:  Chief Complaint  Patient presents with   Urethritis    History of Present Illness:  James Booker is a 23 y.o. male who is seen for evaluation of urethritis. He reports a prior history of chlamydia 3-4 years ago.  In the past 6 months, he has had several different sexual partners.  He reports that his partners have been diagnosed with trichomonas although he has tested negative by report.  He does have occasional discomfort at the urethral meatus with some occasional dysuria.  He reports this primarily occurs after intercourse.  He is not having any urethral discharge.  No penile lesions.  He reports that he does use a condom.  STI testing from 12/24:  negative  Past Medical History:  Past Medical History:  Diagnosis Date   Cannabis use disorder, moderate, in early remission (HCC) 09/07/2019   Depression    Dyspepsia    Infection of lower genitourinary tract due to Chlamydia     Past Surgical History:  No past surgical history on file.  Allergies:  No Known Allergies  Family History:  Family History  Problem Relation Age of Onset   Cancer Other    Healthy Mother    Healthy Father     Social History:  Social History   Tobacco Use   Smoking status: Never   Smokeless tobacco: Never  Vaping Use   Vaping status: Never Used  Substance Use Topics   Alcohol use: Yes   Drug use: Not Currently     Types: Marijuana    Review of symptoms:  Constitutional:  Negative for unexplained weight loss, night sweats, fever, chills ENT:  Negative for nose bleeds, sinus pain, painful swallowing CV:  Negative for chest pain, shortness of breath, exercise intolerance, palpitations, loss of consciousness Resp:  Negative for cough, wheezing, shortness of breath GI:  Negative for nausea, vomiting, diarrhea, bloody stools GU:  Positives noted in HPI; otherwise negative for gross hematuria, urinary incontinence Neuro:  Negative for seizures, poor balance, limb weakness, slurred speech Psych:  Negative for lack of energy, depression, anxiety Endocrine:  Negative for polydipsia, polyuria, symptoms of hypoglycemia (dizziness, hunger, sweating) Hematologic:  Negative for anemia, purpura, petechia, prolonged or excessive bleeding, use of anticoagulants  Allergic:  Negative for difficulty breathing or choking as a result of exposure to anything; no shellfish allergy; no allergic response (rash/itch) to materials, foods  Physical exam: BP (!) 105/52   Pulse (!) 53   Ht 5' 9 (1.753 m)   Wt 157 lb (71.2 kg)   BMI 23.18 kg/m  GENERAL APPEARANCE:  Well appearing, well developed, well nourished, NAD HEENT:  Atraumatic, normocephalic, oropharynx clear NECK:  Supple without lymphadenopathy or thyromegaly ABDOMEN:  Soft, non-tender, no masses EXTREMITIES:  Moves all extremities well, without clubbing, cyanosis, or edema NEUROLOGIC:  Alert and oriented x 3, normal gait, CN II-XII grossly intact MENTAL STATUS:  appropriate BACK:  Non-tender  to palpation, No CVAT SKIN:  Warm, dry, and intact GU: Penis:  circumcised; no lesions Meatus: Normal Scrotum: normal, no masses Testis: normal without masses bilateral Epididymis: normal  Results: U/A: 0-5 WBCs, 0-2 RBCs

## 2024-05-07 ENCOUNTER — Encounter: Payer: Self-pay | Admitting: Family Medicine

## 2024-05-07 DIAGNOSIS — R319 Hematuria, unspecified: Secondary | ICD-10-CM

## 2024-05-09 ENCOUNTER — Encounter: Payer: Self-pay | Admitting: Urology

## 2024-05-09 LAB — CT, NG, MYCOPLASMAS NAA, URINE
Chlamydia trachomatis, NAA: NEGATIVE
Mycoplasma genitalium NAA: POSITIVE — AB
Mycoplasma hominis NAA: NEGATIVE
Neisseria gonorrhoeae, NAA: NEGATIVE
Ureaplasma spp NAA: NEGATIVE

## 2024-05-11 ENCOUNTER — Other Ambulatory Visit: Payer: Self-pay | Admitting: Urology

## 2024-05-11 ENCOUNTER — Ambulatory Visit: Payer: Self-pay | Admitting: Urology

## 2024-05-11 DIAGNOSIS — N342 Other urethritis: Secondary | ICD-10-CM

## 2024-05-11 MED ORDER — DOXYCYCLINE HYCLATE 100 MG PO CAPS: 100.0000 mg | ORAL_CAPSULE | Freq: Two times a day (BID) | ORAL | 0 refills | Status: DC

## 2024-05-11 MED ORDER — AZITHROMYCIN 500 MG PO TABS: ORAL_TABLET | ORAL | 0 refills | Status: DC

## 2024-05-11 MED ORDER — AZITHROMYCIN 500 MG PO TABS: ORAL_TABLET | ORAL | 0 refills | Status: AC

## 2024-05-11 MED ORDER — DOXYCYCLINE HYCLATE 100 MG PO CAPS: 100.0000 mg | ORAL_CAPSULE | Freq: Two times a day (BID) | ORAL | 0 refills | Status: AC

## 2024-05-25 DIAGNOSIS — R300 Dysuria: Secondary | ICD-10-CM | POA: Diagnosis not present

## 2024-05-25 DIAGNOSIS — A493 Mycoplasma infection, unspecified site: Secondary | ICD-10-CM | POA: Diagnosis not present

## 2024-05-25 DIAGNOSIS — R369 Urethral discharge, unspecified: Secondary | ICD-10-CM | POA: Diagnosis not present

## 2024-07-21 ENCOUNTER — Ambulatory Visit: Admitting: Urology

## 2024-07-21 NOTE — Progress Notes (Deleted)
   Assessment: 1. Urethritis   2. High risk heterosexual behavior     Plan: His exam is normal today.  He does not have any evidence of trichomonas on his urinalysis. He is requesting further testing for STIs. STI panel sent today.  Will contact him with results. Discussed limiting high risk sexual behavior to reduce his risk of STIs.   Given the intermittency of his symptoms, I think that any type of urethral stricture would be unlikely. Return to office as needed.  Chief Complaint:  No chief complaint on file.   History of Present Illness:  James Booker is a 23 y.o. male who is seen for further evaluation of urethritis. At his initial visit in June 2025, he reported a prior history of chlamydia 3-4 years ago.  In the prior 6 months, he had several different sexual partners.  He reported that his partners have been diagnosed with trichomonas although he tested negative by report.  He reported occasional discomfort at the urethral meatus with some occasional dysuria.  He reported this primarily occurs after intercourse.  He was not having any urethral discharge.  No penile lesions.  He does use a condom.  STI testing from 12/24:  negative STI panel from 05/04/2024 was positive for mycoplasma genitalium. He was treated with cyclin x 7 days and azithromycin  x 3 days.  Portions of the above documentation were copied from a prior visit for review purposes only.  Past Medical History:  Past Medical History:  Diagnosis Date   Cannabis use disorder, moderate, in early remission (HCC) 09/07/2019   Depression    Dyspepsia    Infection of lower genitourinary tract due to Chlamydia     Past Surgical History:  No past surgical history on file.  Allergies:  No Known Allergies  Family History:  Family History  Problem Relation Age of Onset   Cancer Other    Healthy Mother    Healthy Father     Social History:  Social History   Tobacco Use   Smoking status: Never   Smokeless  tobacco: Never  Vaping Use   Vaping status: Never Used  Substance Use Topics   Alcohol use: Yes   Drug use: Not Currently    Types: Marijuana    ROS: Constitutional:  Negative for fever, chills, weight loss CV: Negative for chest pain, previous MI, hypertension Respiratory:  Negative for shortness of breath, wheezing, sleep apnea, frequent cough GI:  Negative for nausea, vomiting, bloody stool, GERD  Physical exam: There were no vitals taken for this visit. GENERAL APPEARANCE:  Well appearing, well developed, well nourished, NAD HEENT:  Atraumatic, normocephalic, oropharynx clear NECK:  Supple without lymphadenopathy or thyromegaly ABDOMEN:  Soft, non-tender, no masses EXTREMITIES:  Moves all extremities well, without clubbing, cyanosis, or edema NEUROLOGIC:  Alert and oriented x 3, normal gait, CN II-XII grossly intact MENTAL STATUS:  appropriate BACK:  Non-tender to palpation, No CVAT SKIN:  Warm, dry, and intact  Results: U/A:

## 2024-07-24 DIAGNOSIS — A493 Mycoplasma infection, unspecified site: Secondary | ICD-10-CM | POA: Diagnosis not present

## 2024-07-24 DIAGNOSIS — Z113 Encounter for screening for infections with a predominantly sexual mode of transmission: Secondary | ICD-10-CM | POA: Diagnosis not present

## 2024-10-28 DIAGNOSIS — Z113 Encounter for screening for infections with a predominantly sexual mode of transmission: Secondary | ICD-10-CM | POA: Diagnosis not present
